# Patient Record
Sex: Male | Born: 2005 | Race: Black or African American | Hispanic: No | Marital: Single | Smoking: Never smoker
Health system: Southern US, Community
[De-identification: ages and names within clinical notes are randomized; demographics above are authoritative.]

## PROBLEM LIST (undated history)

## (undated) DIAGNOSIS — R55 Syncope and collapse: Secondary | ICD-10-CM

## (undated) HISTORY — DX: Syncope and collapse: R55

## (undated) HISTORY — PX: WISDOM TOOTH EXTRACTION: SHX21

---
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
## 2024-01-01 ENCOUNTER — Encounter: Payer: Self-pay | Admitting: Internal Medicine

## 2024-02-08 DIAGNOSIS — M9906 Segmental and somatic dysfunction of lower extremity: Secondary | ICD-10-CM | POA: Diagnosis not present

## 2024-02-08 DIAGNOSIS — M9905 Segmental and somatic dysfunction of pelvic region: Secondary | ICD-10-CM | POA: Diagnosis not present

## 2024-02-08 DIAGNOSIS — M9903 Segmental and somatic dysfunction of lumbar region: Secondary | ICD-10-CM | POA: Diagnosis not present

## 2024-02-08 DIAGNOSIS — M9902 Segmental and somatic dysfunction of thoracic region: Secondary | ICD-10-CM | POA: Diagnosis not present

## 2024-02-11 ENCOUNTER — Other Ambulatory Visit: Payer: Self-pay | Admitting: Internal Medicine

## 2024-02-12 NOTE — Telephone Encounter (Signed)
 Requested Prescriptions  Pending Prescriptions Disp Refills   traZODone  (DESYREL ) 50 MG tablet [Pharmacy Med Name: TRAZODONE  50 MG TABLET] 90 tablet 0    Sig: TAKE 1/2 TO 1 TABLET BY MOUTH AT BEDTIME AS NEEDED FOR SLEEP     Psychiatry: Antidepressants - Serotonin Modulator Passed - 02/12/2024  2:44 PM      Passed - Valid encounter within last 6 months    Recent Outpatient Visits           4 months ago Abdominal cramping   [facility] [facility], Angeline ORN, [place]

## 2024-04-18 ENCOUNTER — Telehealth: Admitting: Family Medicine

## 2024-04-18 DIAGNOSIS — H00011 Hordeolum externum right upper eyelid: Secondary | ICD-10-CM

## 2024-04-18 MED ORDER — ERYTHROMYCIN 5 MG/GM OP OINT: 1.0000 | TOPICAL_OINTMENT | Freq: Every day | OPHTHALMIC | 0 refills | Status: AC

## 2024-04-18 NOTE — Patient Instructions (Signed)
 Stye A stye, also known as a hordeolum, is a bump that forms on an eyelid. It may look like a pimple next to the eyelash. A stye can form inside the eyelid (internal stye) or outside the eyelid (external stye). A stye can cause redness, swelling, and pain on the eyelid. Styes are very common. Anyone can get them at any age. They usually occur in just one eye at a time, but you may have more than one in either eye. What are the causes? A stye is caused by an infection. The infection is almost always caused by bacteria called Staphylococcus aureus. This is a common type of bacteria that lives on the skin. An internal stye may result from an infected oil-producing gland inside the eyelid. An external stye may be caused by an infection at the base of the eyelash (hair follicle). What increases the risk? You are more likely to develop a stye if: You have had a stye before. You have any of these conditions: Red, itchy, inflamed eyelids (blepharitis). A skin condition such as seborrheic dermatitis or rosacea. High fat levels in your blood (lipids). Dry eyes. What are the signs or symptoms? The most common symptom of a stye is eyelid pain. Internal styes are more painful than external styes. Other symptoms may include: Painful swelling of your eyelid. A scratchy feeling in your eye. Tearing and redness of your eye. A pimple-like bump on the edge of the eyelid. Pus draining from the stye. How is this diagnosed? Your health care provider may be able to diagnose a stye just by examining your eye. The health care provider may also check to make sure: You do not have a fever or other signs of a more serious infection. The infection has not spread to other parts of your eye or areas around your eye. How is this treated? Most styes will clear up in a few days without treatment or with warm compresses applied to the area. You may need to use antibiotic drops or ointment to treat an infection. Sometimes,  steroid drops or ointment are used in addition to antibiotics. In some cases, your health care provider may give you a small steroid injection in the eyelid. If your stye does not heal with routine treatment, your health care provider may drain pus from the stye using a thin blade or needle. This may be done if the stye is large, causing a lot of pain, or affecting your vision. Follow these instructions at home: Take over-the-counter and prescription medicines only as told by your health care provider. This includes eye drops or ointments. If you were prescribed an antibiotic medicine, steroid medicine, or both, apply or use them as told by your health care provider. Do not stop using the medicine even if your condition improves. Apply a warm, wet cloth (warm compress) to your eye for 5-10 minutes, 4 to 6 times a day. Clean the affected eyelid as directed by your health care provider. Do not wear contact lenses or eye makeup until your stye has healed and your health care provider says that it is safe. Do not try to pop or drain the stye. Do not rub your eye. Contact a health care provider if: You have chills or a fever. Your stye does not go away after several days. Your stye affects your vision. Your eyeball becomes swollen, red, or painful. Get help right away if: You have pain when moving your eye around. Summary A stye is a bump that forms  on an eyelid. It may look like a pimple next to the eyelash. A stye can form inside the eyelid (internal stye) or outside the eyelid (external stye). A stye can cause redness, swelling, and pain on the eyelid. Your health care provider may be able to diagnose a stye just by examining your eye. Apply a warm, wet cloth (warm compress) to your eye for 5-10 minutes, 4 to 6 times a day. This information is not intended to replace advice given to you by your health care provider. Make sure you discuss any questions you have with your health care  provider. Document Revised: 10/06/2020 Document Reviewed: 10/06/2020 Elsevier Patient Education  2024 [employer].

## 2024-04-18 NOTE — Progress Notes (Signed)
 Virtual Visit Consent   Mathew Osborn, you are scheduled for a virtual visit with a [facility] provider today. Just as with appointments in the office, your consent must be obtained to participate. Your consent will be active for this visit and any virtual visit you may have with one of our providers in the next 365 days. If you have a MyChart account, a copy of this consent can be sent to you electronically.  As this is a virtual visit, video technology does not allow for your provider to perform a traditional examination. This may limit your provider's ability to fully assess your condition. If your provider identifies any concerns that need to be evaluated in person or the need to arrange testing (such as labs, EKG, etc.), we will make arrangements to do so. Although advances in technology are sophisticated, we cannot ensure that it will always work on either your end or our end. If the connection with a video visit is poor, the visit may have to be switched to a telephone visit. With either a video or telephone visit, we are not always able to ensure that we have a secure connection.  By engaging in this virtual visit, you consent to the provision of healthcare and authorize for your insurance to be billed (if applicable) for the services provided during this visit. Depending on your insurance coverage, you may receive a charge related to this service.  I need to obtain your verbal consent now. Are you willing to proceed with your visit today? Mathew Osborn has provided verbal consent on 04/18/2024 for a virtual visit (video or telephone). Loa Lamp, FNP  Date: 04/18/2024 7:03 PM   Virtual Visit via Video Note   I, Loa Lamp, connected with  Mathew Osborn  ([id number], Aug 30, 2005) on 04/18/24 at  7:00 PM EDT by a video-enabled telemedicine application and verified that I am speaking with the correct person using two identifiers.  Location: Patient: Virtual Visit Location Patient:  Home Provider: Virtual Visit Location Provider: Home Office   I discussed the limitations of evaluation and management by telemedicine and the availability of in person appointments. The patient expressed understanding and agreed to proceed.    History of Present Illness: Mathew Osborn is a 18 y.o. who identifies as a male who was assigned male at birth, and is being seen today for a stye on the rt upper eyelid.for several days. 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    Allergies: No Known Allergies Medications:  Current Outpatient Medications:    erythromycin  ophthalmic ointment, Place 1 Application into the right eye at bedtime for 7 days., Disp: 7 g, Rfl: 0   dicyclomine  (BENTYL ) 10 MG capsule, TAKE 1 CAPSULE (10 MG TOTAL) BY MOUTH 4 TIMES A DAY BEFORE MEALS AND AT BEDTIME, Disp: 360 capsule, Rfl: 0   Selenium  Sulfide 2.25 % SHAM, Apply 1 Application topically daily., Disp: 180 mL, Rfl: 5   traZODone  (DESYREL ) 50 MG tablet, TAKE 1/2 TO 1 TABLET BY MOUTH AT BEDTIME AS NEEDED FOR SLEEP, Disp: 90 tablet, Rfl: 0   triamcinolone  cream (KENALOG ) 0.1 %, APPLY TO AFFECTED AREA TWICE A DAY (Patient taking differently: Apply 1 Application topically 2 (two) times daily.), Disp: 30 g, Rfl: 0  Observations/Objective: Patient is well-developed, well-nourished in no acute distress.  Resting comfortably  at home.  Head is normocephalic, atraumatic.  No labored breathing.  Speech is clear and coherent with logical content.  Patient is alert and oriented at baseline.   Assessment and Plan: 1. Hordeolum of right upper eyelid, unspecified hordeolum type (Primary)  Warm compresses, opthlamology as needed.   Follow Up Instructions: I discussed the assessment and treatment plan with the patient. The patient was provided an opportunity to ask questions and all were answered. The patient  agreed with the plan and demonstrated an understanding of the instructions.  A copy of instructions were sent to the patient via MyChart unless otherwise noted below.     The patient was advised to call back or seek an in-person evaluation if the symptoms worsen or if the condition fails to improve as anticipated.    Roselyn Doby, FNP

## 2024-08-18 ENCOUNTER — Other Ambulatory Visit: Payer: Self-pay

## 2024-08-18 ENCOUNTER — Encounter: Payer: Self-pay | Admitting: Internal Medicine

## 2024-08-18 VITALS — BP 124/66 | HR 96 | Temp 99.2°F | Ht 76.0 in | Wt 222.8 lb

## 2024-08-18 DIAGNOSIS — F40298 Other specified phobia: Secondary | ICD-10-CM | POA: Diagnosis not present

## 2024-08-18 DIAGNOSIS — J019 Acute sinusitis, unspecified: Secondary | ICD-10-CM

## 2024-08-18 DIAGNOSIS — R0683 Snoring: Secondary | ICD-10-CM | POA: Diagnosis not present

## 2024-08-18 DIAGNOSIS — T7800XA Anaphylactic reaction due to unspecified food, initial encounter: Secondary | ICD-10-CM

## 2024-08-18 DIAGNOSIS — R1112 Projectile vomiting: Secondary | ICD-10-CM | POA: Diagnosis not present

## 2024-08-18 MED ORDER — NEFFY 2 MG/0.1ML NA SOLN: 1.0000 | NASAL | 1 refills | Status: AC | PRN

## 2024-08-18 MED ORDER — AMOXICILLIN-POT CLAVULANATE 875-125 MG PO TABS: 1.0000 | ORAL_TABLET | Freq: Two times a day (BID) | ORAL | 0 refills | Status: AC

## 2024-08-18 NOTE — Patient Instructions (Signed)
 Acute sinusitis Increased mucus production, nasal congestion, and sore throat for two weeks. Possible sinus infection due to significant mucus and recent weight gain. Differential includes allergic rhinitis and sinus infection. - Prescribed a 7-day course of Augmentin . Take twice daily with food. - Recommended saline spray or rinse to loosen mucus. - Advised Mucinex to help loosen mucus and facilitate expectoration.  Suspected allergic rhinitis Suspected allergic rhinitis with increased mucus production and nasal congestion, possibly exacerbated by exposure to a pet rabbit.  No prior allergy  testing or regular use of allergy  medications. No asthma symptoms, but sleep apnea suspected due to snoring and possible apneic episodes. - return for environmental allergy  testing and labs for rabbit - Recommended Flonase nasal spray if needed for nasal congestion. - Advised follow-up with primary care for sleep study to evaluate for sleep apnea. 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 Family history of tree nut allergy . - Ordered allergy  testing, including skin testing and blood work for tree nut allergy . - Prescribed nasal epinephrine  spray for potential allergic reactions. - Advised avoidance of tree nuts, but allow consumption of peanuts. - Instructed to avoid antihistamines until allergy  testing is completed.  Follow up : return this Friday for allergy  testing (1-55, [place]) and labs for rabbit IgE It was a pleasure meeting you in clinic today! Thank you for allowing me to participate in your care.  Rocky Endow, MD [facility]

## 2024-08-18 NOTE — Progress Notes (Signed)
 "  NEW PATIENT Date of Service/Encounter:   08/18/2024 Referring provider: none-self referred Primary care provider: Antonette Angeline ORN, NP  Subjective:  DANISH RUFFINS is a 19 y.o. male with a PMHx of tinea versicolor presenting today for evaluation of chronic rhinitis and breathing concerns.  History obtained from: chart review and patient and mother.   Discussed the use of AI scribe software for clinical note transcription with the patient, who gave verbal consent to proceed.  History of Present Illness MARSHA HILLMAN is a 19 year old male who presents with concerns about allergies and possible asthma. He is accompanied by his mother.  Upper respiratory symptoms - Increased mucus production for two weeks, persistent since returning home from college (rabbit in home) - Morning routine of clearing mucus before starting the day ongoing for years - Sore throat for two days, with mucus traveling between throat and nose causing nasal obstruction - Recent use of Flonase and Zycam nasal sprays for symptom relief without significant relief - No prior allergy  testing - No regular use of allergy  medications  Allergic triggers and exposures - Owns a pet rabbit, suspected as a potential allergen; rabbit has been moved away from his room - 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 cashews, with vomiting after ingestion; avoids all tree nuts but tolerates peanuts - No other food allergies, medication allergies, or reactions to insect stings - Family history of nut allergies in father and sister  Sleep-disordered breathing - New onset of loud snoring, as observed by his mother - Pauses in breathing during sleep - Recent weight gain over the past year, from 175 to 220 pounds  Lower respiratory symptoms - No shortness of breath or wheezing during physical activity - Occasional difficulty breathing in cold weather  Other relevant symptoms and history - History of pneumonia during sophomore year, believed to have  initiated ongoing mucus production - No history of eczema - Uses specific shampoo for Versicolor - Recent headaches, not typical for him Past Medical History: Past Medical History:  Diagnosis Date   Insomnia 12/27/2022   Syncope and collapse    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 180 mL 5   traZODone  (DESYREL ) 50 MG tablet TAKE 1/2 TO 1 TABLET BY MOUTH AT BEDTIME AS NEEDED FOR SLEEP 90 tablet 0   triamcinolone  cream (KENALOG ) 0.1 % APPLY TO AFFECTED AREA TWICE A DAY (Patient not taking: Reported on 08/18/2024) 30 g 0   No current facility-administered medications for this visit.   Known Allergies:  Allergies[1] Past Surgical History: Past Surgical History:  Procedure Laterality Date   WISDOM TOOTH EXTRACTION     Family History: Family History  Problem Relation Age of Onset   Diabetes Mother    Hypertension Mother    Allergic rhinitis Father    Eczema Sister    Asthma Sister    Allergic rhinitis Sister    Asthma Sister    Healthy Sister    Hypertension Maternal Grandmother    Diabetes Maternal Grandmother    Hypertension Maternal Grandfather    Diabetes Maternal Grandfather    Diabetes Paternal Grandmother    Allergic rhinitis Paternal Grandfather    Asthma  Paternal Grandfather    Colon cancer Neg Hx    Prostate cancer Neg Hx    Social History: [place] lives in a house built 2 years ago, no water damage, LVP floors, gas heating, central AC, pet rabbit at home, not in college dorms, no roaches, no smoke exposure, no HEPA filter, nonsmoker.   ROS:  All other systems negative except as noted per HPI.  Objective:  Blood pressure 124/66, pulse  96, temperature 99.2 F (37.3 C), temperature source Temporal, height 6' 4 (1.93 m), weight 222 lb 12.8 oz (101.1 kg), SpO2 95%. Body mass index is 27.12 kg/m. Physical Exam:  General Appearance:  Alert, cooperative, no distress, appears stated age  Head:  Normocephalic, without obvious abnormality, atraumatic  Eyes:  Conjunctiva clear, EOM's intact  Ears EACs normal bilaterally and normal TMs bilaterally  Nose: Nares normal, hypertrophic turbinates, normal mucosa, and no visible anterior polyps  Throat: Lips, tongue normal; teeth and gums normal, tonsils 3+  Neck: Supple, symmetrical  Lungs:   clear to auscultation bilaterally, Respirations unlabored, no coughing  Heart:  regular rate and rhythm and no murmur, Appears well perfused  Extremities: No edema  Skin: Skin color, texture, turgor normal and no rashes or lesions on visualized portions of skin  Neurologic: No gross deficits   Diagnostics: Spirometry:  Tracings reviewed. His effort: It was hard to get consistent efforts and there is a question as to whether this reflects a maximal maneuver. FVC: 4.85L  FEV1: 3.68L, 77% predicted FEV1/FVC ratio: 0.76  Interpretation: Nonobstructive ratio, low FEV1. Suspected partially related to technique Please see scanned spirometry results for details. Labs:  Lab Orders  No laboratory test(s) ordered today     Assessment and Plan  Assessment and Plan Assessment & Plan Acute sinusitis Increased mucus production, nasal congestion, and sore throat for two weeks. Possible sinus infection due to significant mucus and recent weight gain. Differential includes allergic rhinitis and sinus infection. - Prescribed a 7-day course of Augmentin . Take twice daily with food. - Recommended saline spray or rinse to loosen mucus. - Advised Mucinex to help loosen mucus and facilitate expectoration.  Suspected allergic rhinitis Suspected allergic rhinitis with increased mucus production and nasal  congestion, possibly exacerbated by exposure to a pet rabbit.  No prior allergy  testing or regular use of allergy  medications. No asthma symptoms, but sleep apnea suspected due to snoring and possible apneic episodes. - return for environmental allergy  testing and labs for rabbit - Recommended Flonase nasal spray if needed for nasal congestion. - Advised follow-up with primary care for sleep study to evaluate for sleep apnea. 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 Family history of tree nut allergy . - Ordered allergy  testing, including skin testing and blood work for tree nut allergy . - Prescribed nasal epinephrine  spray for potential allergic reactions. - Advised avoidance of tree nuts, but allow consumption of peanuts. - Instructed to avoid antihistamines until allergy  testing is completed. - allergy  action plan provided and reviewed  Follow up : return this Friday for allergy  testing (1-55, [place]) and labs for rabbit IgE It was a pleasure meeting you in clinic today! Thank you for allowing me to participate in your care.  Rocky Endow, MD [facility]    This note in its entirety was forwarded to the Provider who requested this  consultation.  Other: none  Thank you for your kind referral. I appreciate the opportunity to take part in Levell's care. Please do not hesitate to contact me with questions.  Sincerely,  Rocky Endow, MD [facility]          [1] No Known Allergies  "

## 2024-08-19 ENCOUNTER — Ambulatory Visit: Admitting: Internal Medicine

## 2024-08-20 ENCOUNTER — Encounter: Payer: Self-pay | Admitting: Allergy

## 2024-08-20 ENCOUNTER — Ambulatory Visit: Admitting: Allergy

## 2024-08-20 DIAGNOSIS — J3089 Other allergic rhinitis: Secondary | ICD-10-CM | POA: Diagnosis not present

## 2024-08-20 DIAGNOSIS — T7800XA Anaphylactic reaction due to unspecified food, initial encounter: Secondary | ICD-10-CM

## 2024-08-20 DIAGNOSIS — J302 Other seasonal allergic rhinitis: Secondary | ICD-10-CM

## 2024-08-20 DIAGNOSIS — T7800XD Anaphylactic reaction due to unspecified food, subsequent encounter: Secondary | ICD-10-CM | POA: Diagnosis not present

## 2024-08-20 NOTE — Patient Instructions (Addendum)
 Allergic rhinitis - Continue Flonase nasal spray if needed for nasal congestion. - Advised follow-up with primary care for sleep study to evaluate for sleep apnea. - Testing today showed: grasses, weeds, trees, and cat - Copy of test results provided.  - Avoidance measures provided. - Rabbit IgE lab ordered - Consider allergy  shots as a means of long-term control if medication management is not effective enough - Allergy  shots re-train and reset the immune system to ignore environmental allergens and decrease the resulting immune response to those allergens (sneezing, itchy watery eyes, runny nose, nasal congestion, etc).    - Allergy  shots improve symptoms in 75-85% of patients.  - We can discuss more at the next appointment if the medications are not working for you.   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   - Prescribed nasal epinephrine  spray for potential allergic reactions.   - Have access to epinephrine  device in case of reaction. - Advised avoidance of tree nuts - Keep consumption of peanuts in diet  Follow up : 6 months or sooner if needed    Avoidance measures:  Control of Dog or Cat Allergen  Avoidance is the best way to manage a dog or cat allergy . If you have a dog or cat and are allergic to dog or cats, consider removing the dog or cat from the home. If you have a dog or cat but dont want to find it a new home, or if your family wants a pet even though someone in the household is allergic, here are some strategies that may help keep symptoms at bay:  Keep the pet out of your bedroom and restrict it to only a few rooms. Be advised that keeping the dog or cat in only one room will not limit the allergens to that room. Dont pet, hug or kiss the dog or cat; if you do, wash your hands with soap and water. High-efficiency particulate air (HEPA) cleaners run continuously in a bedroom or living room can reduce allergen levels  over time. Regular use of a high-efficiency vacuum cleaner or a central vacuum can reduce allergen levels. Giving your dog or cat a bath at least once a week can reduce airborne allergen.   Reducing Pollen Exposure  The American Academy of Allergy , Asthma and Immunology suggests the following steps to reduce your exposure to pollen during allergy  seasons.    Do not hang sheets or clothing out to dry; pollen may collect on these items. Do not mow lawns or spend time around freshly cut grass; mowing stirs up pollen. Keep windows closed at night.  Keep car windows closed while driving. Minimize morning activities outdoors, a time when pollen counts are usually at their highest. Stay indoors as much as possible when pollen counts or humidity is high and on windy days when pollen tends to remain in the air longer. Use air conditioning when possible.  Many air conditioners have filters that trap the pollen spores. Use a HEPA room air filter to remove pollen form the indoor air you breathe.

## 2024-08-20 NOTE — Progress Notes (Signed)
 "   Follow-up Note  RE: Mathew Osborn MRN: [id number] DOB: [birth year] Date of Office Visit: 08/20/2024   History of present illness: Mathew Osborn is a 19 y.o. male presenting today for skin testing visit.  He was last seen in the office on 08/18/24 by Dr. Marinda.  He is in her usual state of health today without recent illness.  He has held antihistamines for at least 3 days for testing today.  He presents today with his mother.    Medication List: Current Outpatient Medications  Medication Sig Dispense Refill   amoxicillin -clavulanate (AUGMENTIN ) 875-125 MG tablet Take 1 tablet by mouth 2 (two) times daily for 7 days. 14 tablet 0   dicyclomine  (BENTYL ) 10 MG capsule TAKE 1 CAPSULE (10 MG TOTAL) BY MOUTH 4 TIMES A DAY BEFORE MEALS AND AT BEDTIME (Patient taking differently: Take 10 mg by mouth as needed.) 360 capsule 0   EPINEPHrine  (NEFFY ) 2 MG/0.1ML SOLN Place 1 spray into the nose as needed (anaphylaxis). 3 each 1   Selenium  Sulfide 2.25 % SHAM Apply 1 Application topically daily. 180 mL 5   traZODone  (DESYREL ) 50 MG tablet TAKE 1/2 TO 1 TABLET BY MOUTH AT BEDTIME AS NEEDED FOR SLEEP 90 tablet 0   triamcinolone  cream (KENALOG ) 0.1 % APPLY TO AFFECTED AREA TWICE A DAY (Patient not taking: Reported on 08/18/2024) 30 g 0   No current facility-administered medications for this visit.     Known medication allergies: Allergies[1]   Diagnostics/Labs:  Allergy  testing:   Airborne Adult Perc - 08/20/24 1421     Time Antigen Placed 1421    Allergen Manufacturer Jestine    Location Back    Number of Test 55    1. Control-Buffer 50% Glycerol Negative    2. Control-Histamine 2+    3. Bahia Negative    4. Bermuda Negative    5. Johnson 2+    6. [place]  Blue Negative    7. Meadow Fescue Negative    8. Perennial Rye Negative    9. Timothy Negative    10. Ragweed Mix Negative    11. Cocklebur 2+    12. Plantain,  English Negative    13. Baccharis 2+    14. Dog Fennel Negative     15. Russian Thistle Negative    16. Lamb's Quarters Negative    17. Sheep Sorrell 2+    18. Rough Pigweed Negative    19. Marsh Elder, Rough 2+    20. Mugwort, Common Negative    21. Box, Elder Negative    22. Cedar, red Negative    23. Sweet Gum Negative    24. Pecan Pollen 3+    25. Pine Mix Negative    26. Walnut, Black Pollen Negative    27. Red Mulberry Negative    28. Ash Mix 3+    29. Birch Mix Negative    30. Beech American Negative    31. Cottonwood, Eastern Negative    32. Hickory, White 2+    33. Maple Mix Negative    34. Oak, Eastern Mix 2+    35. Sycamore Eastern Negative    36. Alternaria Alternata Negative    37. Cladosporium Herbarum Negative    38. Aspergillus Mix Negative    39. Penicillium Mix Negative    40. Bipolaris Sorokiniana (Helminthosporium) Negative    41. Drechslera Spicifera (Curvularia) Negative    42. Mucor Plumbeus Negative    43. Fusarium Moniliforme Negative    44.  Aureobasidium Pullulans (pullulara) Negative    45. Rhizopus Oryzae Negative    46. Botrytis Cinera Negative    47. Epicoccum Nigrum Negative    48. Phoma Betae Negative    49. Dust Mite Mix Negative    50. Cat Hair 10,000 BAU/ml 2+    51.  Dog Epithelia Negative    52. Mixed Feathers Negative    53. Horse Epithelia Negative    54. Cockroach, German Negative    55. Tobacco Leaf Negative          Food Adult Perc - 08/20/24 1400     Time Antigen Placed 1423    Allergen Manufacturer Jestine    Location Back    Number of allergen test 7    10. Cashew 4+    11. Walnut Food Negative    12. Almond Negative    13. Hazelnut Negative    14. Pecan Food 2+    15. Pistachio 4+    16. Brazil Nut Negative          Allergy  testing results were read and interpreted by provider, documented by clinical staff.   Assessment and plan: Allergic rhinitis - Continue Flonase nasal spray if needed for nasal congestion. - Advised follow-up with primary care for sleep study to  evaluate for sleep apnea. - Testing today showed: grasses, weeds, trees, and cat - Copy of test results provided.  - Avoidance measures provided. - Rabbit IgE lab ordered - Consider allergy  shots as a means of long-term control if medication management is not effective enough - Allergy  shots re-train and reset the immune system to ignore environmental allergens and decrease the resulting immune response to those allergens (sneezing, itchy watery eyes, runny nose, nasal congestion, etc).    - Allergy  shots improve symptoms in 75-85% of patients.  - We can discuss more at the next appointment if the medications are not working for you.   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   - Prescribed nasal epinephrine  spray for potential allergic reactions.   - Have access to epinephrine  device in case of reaction. - Advised avoidance of tree nuts - Keep consumption of peanuts in diet  Follow up : 6 months or sooner if needed  I appreciate the opportunity to take part in Mathew Osborn's care. Please do not hesitate to contact me with questions.  Sincerely,   Danita Brain, MD Allergy /Immunology [facility]      [1]  Allergies Allergen Reactions   Other     TREE NUTS   "

## 2024-08-22 LAB — E082-IGE RABBIT EPITHELIA: Rabbit Epithelia IgE: <0.1 kU/L

## 2024-08-26 ENCOUNTER — Ambulatory Visit: Payer: Self-pay | Admitting: Allergy

## 2025-02-18 ENCOUNTER — Ambulatory Visit: Admitting: Allergy
